# Patient Record
Sex: Female | Born: 1975
Health system: Southern US, Community
[De-identification: ages and names within clinical notes are randomized; demographics above are authoritative.]

---

## 2004-06-29 ENCOUNTER — Inpatient Hospital Stay (HOSPITAL_COMMUNITY): Admission: AD | Admit: 2004-06-29 | Discharge: 2004-06-29 | Payer: Self-pay | Admitting: Obstetrics and Gynecology

## 2004-06-30 ENCOUNTER — Inpatient Hospital Stay (HOSPITAL_COMMUNITY): Admission: AD | Admit: 2004-06-30 | Discharge: 2004-06-30 | Payer: Self-pay | Admitting: Obstetrics & Gynecology

## 2004-07-04 ENCOUNTER — Inpatient Hospital Stay (HOSPITAL_COMMUNITY): Admission: AD | Admit: 2004-07-04 | Discharge: 2004-07-04 | Payer: Self-pay | Admitting: Obstetrics & Gynecology

## 2004-08-14 ENCOUNTER — Inpatient Hospital Stay (HOSPITAL_COMMUNITY): Admission: AD | Admit: 2004-08-14 | Discharge: 2004-08-16 | Payer: Self-pay | Admitting: Obstetrics and Gynecology

## 2004-08-17 ENCOUNTER — Inpatient Hospital Stay (HOSPITAL_COMMUNITY): Admission: AD | Admit: 2004-08-17 | Discharge: 2004-08-17 | Payer: Self-pay | Admitting: Obstetrics and Gynecology

## 2004-08-18 ENCOUNTER — Encounter: Admission: RE | Admit: 2004-08-18 | Discharge: 2004-09-17 | Payer: Self-pay | Admitting: Obstetrics and Gynecology

## 2004-08-18 ENCOUNTER — Inpatient Hospital Stay (HOSPITAL_COMMUNITY): Admission: AD | Admit: 2004-08-18 | Discharge: 2004-08-18 | Payer: Self-pay | Admitting: Obstetrics and Gynecology

## 2006-04-11 ENCOUNTER — Inpatient Hospital Stay (HOSPITAL_COMMUNITY): Admission: AD | Admit: 2006-04-11 | Discharge: 2006-04-13 | Payer: Self-pay | Admitting: Obstetrics and Gynecology

## 2011-10-10 ENCOUNTER — Emergency Department (HOSPITAL_COMMUNITY): Payer: BC Managed Care – PPO

## 2011-10-10 ENCOUNTER — Emergency Department (HOSPITAL_COMMUNITY)
Admission: EM | Admit: 2011-10-10 | Discharge: 2011-10-10 | Disposition: A | Discharge status: Home / Self Care | Payer: BC Managed Care – PPO | Attending: Emergency Medicine | Admitting: Emergency Medicine

## 2011-10-10 DIAGNOSIS — R209 Unspecified disturbances of skin sensation: Secondary | ICD-10-CM | POA: Insufficient documentation

## 2011-10-10 DIAGNOSIS — R200 Anesthesia of skin: Secondary | ICD-10-CM

## 2011-10-10 DIAGNOSIS — R110 Nausea: Secondary | ICD-10-CM | POA: Insufficient documentation

## 2011-10-10 DIAGNOSIS — Q279 Congenital malformation of peripheral vascular system, unspecified: Secondary | ICD-10-CM

## 2011-10-10 LAB — POCT I-STAT, CHEM 8
BUN: 19 mg/dL (ref 6–23)
Calcium, Ion: 1.15 mmol/L (ref 1.12–1.23)
Chloride: 105 mEq/L (ref 96–112)
Creatinine, Ser: 0.7 mg/dL (ref 0.50–1.10)
TCO2: 19 mmol/L (ref 0–100)

## 2011-10-10 LAB — CK TOTAL AND CKMB (NOT AT ARMC)
CK, MB: 1.4 ng/mL (ref 0.3–4.0)
Relative Index: INVALID (ref 0.0–2.5)
Total CK: 58 U/L (ref 7–177)

## 2011-10-10 LAB — URINALYSIS, ROUTINE W REFLEX MICROSCOPIC
Bilirubin Urine: NEGATIVE
Glucose, UA: NEGATIVE mg/dL
Hgb urine dipstick: NEGATIVE
Ketones, ur: NEGATIVE mg/dL
Protein, ur: NEGATIVE mg/dL
pH: 6.5 (ref 5.0–8.0)

## 2011-10-10 LAB — DIFFERENTIAL
Eosinophils Absolute: 0.1 10*3/uL (ref 0.0–0.7)
Eosinophils Relative: 1 % (ref 0–5)
Lymphs Abs: 3.1 10*3/uL (ref 0.7–4.0)
Monocytes Absolute: 0.6 10*3/uL (ref 0.1–1.0)

## 2011-10-10 LAB — RAPID URINE DRUG SCREEN, HOSP PERFORMED
Amphetamines: NOT DETECTED
Benzodiazepines: NOT DETECTED
Cocaine: NOT DETECTED
Opiates: NOT DETECTED

## 2011-10-10 LAB — COMPREHENSIVE METABOLIC PANEL
ALT: 16 U/L (ref 0–35)
Calcium: 9.6 mg/dL (ref 8.4–10.5)
Creatinine, Ser: 0.68 mg/dL (ref 0.50–1.10)
GFR calc Af Amer: >90 mL/min (ref >90)
GFR calc non Af Amer: >90 mL/min (ref >90)
Glucose, Bld: 104 mg/dL — ABNORMAL HIGH (ref 70–99)
Sodium: 136 mEq/L (ref 135–145)
Total Protein: 7.4 g/dL (ref 6.0–8.3)

## 2011-10-10 LAB — CBC
HCT: 42.9 % (ref 36.0–46.0)
MCH: 32.5 pg (ref 26.0–34.0)
MCV: 94.1 fL (ref 78.0–100.0)
Platelets: 271 10*3/uL (ref 150–400)
RBC: 4.56 MIL/uL (ref 3.87–5.11)
RDW: 12.7 % (ref 11.5–15.5)

## 2011-10-10 LAB — PROTIME-INR: Prothrombin Time: 12.9 seconds (ref 11.6–15.2)

## 2011-10-10 LAB — POCT PREGNANCY, URINE: Preg Test, Ur: NEGATIVE

## 2011-10-10 LAB — TROPONIN I: Troponin I: <0.3 ng/mL (ref <0.30)

## 2011-10-10 LAB — URINE MICROSCOPIC-ADD ON

## 2011-10-10 MED ORDER — GADOBENATE DIMEGLUMINE 529 MG/ML IV SOLN
12.0000 mL | Freq: Once | INTRAVENOUS | Status: AC | PRN
  Administered 2011-10-10: 12 mL via INTRAVENOUS

## 2011-10-10 NOTE — ED Notes (Addendum)
Pt in c/o right sided facial numbness x1 hour, states all day she has been feeling nauseous and states she just hasn't felt right, after dinner states she developed severe heart burn, states she has not had that since she was pregnant. Mild right facial droop noted, face symmetrical in movement, no other neuro deficits noted, no weakness to arms or legs. Pt tearful in triage.

## 2011-10-10 NOTE — ED Notes (Signed)
Code Stroke activated per EDP Hunt

## 2011-10-10 NOTE — ED Provider Notes (Signed)
BP 144/99  Pulse 91  Temp 98.5 F (36.9 C) (Oral)  Resp 20  SpO2 97%  LMP 09/09/2011   F/U MRI without CVA or other lesion. 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 Do not suspect as cause of sx. No headache. Will give her f/u with neuro and neurosurgery. No EMC precluding discharge at this time. Given Precautions for return. PMD f/u.   Forbes Cellar, MD 10/10/11 1301

## 2011-10-10 NOTE — ED Notes (Signed)
Patient transported to CT 

## 2011-10-10 NOTE — ED Provider Notes (Signed)
History     CSN: [id number]  Arrival date & time 10/10/11  0202   First MD Initiated Contact with Patient 10/10/11 [phone]      Chief Complaint  Patient presents with  . Facial Numbness   . Nausea    (Consider location/radiation/quality/duration/timing/severity/associated sxs/prior treatment) HPI Patient is a 36 year old female who presents after having acute onset of right lower sided facial numbness at 1 AM. Patient noticed this when she got up to go to the restroom. Patient felt that perhaps the right side of her face looks swollen but denied any weakness. She does not have an obvious facial droop. She's had no associated chest pain or shortness of breath. Patient denies any dysarthria or extremity symptoms. Patient has had recent viral symptoms with some nausea, vomiting, and diarrhea which she thought was something that she caught from her child who had similar symptoms. Patient denies any headache with this. Patient is hypertensive to 190 0 120s on presentation. She has no history of hypertension. She reports that the highest her blood pressures at her primary care physician's office was 130 to 140 systolic. Patient has no family history of early stroke. Patient does not have any other significant findings. No past medical history on file.  No past surgical history on file.  No family history on file.  History  Substance Use Topics  . Smoking status: Not on file  . Smokeless tobacco: Not on file  . Alcohol Use: Not on file    OB History    No data available      Review of Systems  Constitutional: Negative.   HENT: Negative.   Eyes: Negative.   Respiratory: Negative.   Cardiovascular: Negative.   Gastrointestinal: Negative.   Genitourinary: Negative.   Musculoskeletal: Negative.   Skin: Negative.   Neurological: Positive for numbness.  Hematological: Negative.   Psychiatric/Behavioral: Negative.   All other systems reviewed and are negative.    Allergies    Codeine  Home Medications   Current Outpatient Rx  Name Route Sig Dispense Refill  . BISMUTH SUBSALICYLATE 262 MG PO CHEW Oral Chew 524 mg by mouth as needed. Upset stomach.    . CETIRIZINE HCL 10 MG PO TABS Oral Take 10 mg by mouth daily.    Marland Kitchen FLUTICASONE PROPIONATE 50 MCG/ACT NA SUSP Nasal Place 2 sprays into the nose daily.    Marland Kitchen LO/OVRAL PO Oral Take 1 tablet by mouth daily.      BP 143/87  Pulse 82  Temp 98.1 F (36.7 C)  Resp 16  SpO2 100%  LMP 09/09/2011  Physical Exam  Nursing note and vitals reviewed. GEN: Well-developed, well-nourished female in no distress HEENT: Atraumatic, normocephalic. Oropharynx clear without erythema. TMs clear bilaterally. EYES: PERRLA BL, no scleral icterus. NECK: Trachea midline, no meningismus CV: regular rate and rhythm. No murmurs, rubs, or gallops PULM: No respiratory distress.  No crackles, wheezes, or rales. GI: soft, non-tender. No guarding, rebound, or tenderness. + bowel sounds  GU: deferred Neuro: Alert and oriented x3. Patient with right sided lower facial numbness without facial droop. Patient has full movement of the eyebrows bilaterally. Eyes appear symmetric. Patient has no dysarthria.  Patient has no other abnormalities of strength or sensation. NIH stroke scale is at worst a 2 with 1 point for partial facial numbness and one point for mild facial asymmetry  MSK: Patient moves all 4 extremities symmetrically, no deformity, edema, or injury noted Skin: No rashes petechiae, purpura, or jaundice Psych: no abnormality  of mood   ED Course  Procedures (including critical care time)   Indication: stroke-like symptoms Please note this EKG was reviewed extemporaneously by myself.   Date: 10/10/2011  Rate: 85  Rhythm: normal sinus rhythm  QRS Axis: normal  Intervals: normal  ST/T Wave abnormalities: normal  Conduction Disutrbances: none  Narrative Interpretation: unremarkable         Labs Reviewed  COMPREHENSIVE  METABOLIC PANEL - Abnormal; Notable for the following:    Glucose, Bld 104 (*)     All other components within normal limits  URINALYSIS, ROUTINE W REFLEX MICROSCOPIC - Abnormal; Notable for the following:    Leukocytes, UA SMALL (*)     All other components within normal limits  POCT I-STAT, CHEM 8 - Abnormal; Notable for the following:    Glucose, Bld 101 (*)     Hemoglobin 15.6 (*)     All other components within normal limits  URINE MICROSCOPIC-ADD ON - Abnormal; Notable for the following:    Squamous Epithelial / LPF FEW (*)     All other components within normal limits  PROTIME-INR  APTT  CBC  DIFFERENTIAL  URINE RAPID DRUG SCREEN (HOSP PERFORMED)  POCT PREGNANCY, URINE  GLUCOSE, CAPILLARY  CK TOTAL AND CKMB  TROPONIN I   Ct Head Wo Contrast  10/10/2011  *RADIOLOGY REPORT*  Clinical Data: Right-sided facial numbness and droop.  CT HEAD WITHOUT CONTRAST  Technique:  Contiguous axial images were obtained from the base of the skull through the vertex without contrast.  Comparison: None.  Findings: There is no evidence of acute infarction, mass lesion, or intra- or extra-axial hemorrhage on CT.  The posterior fossa, including the cerebellum, brainstem and fourth ventricle, is within normal limits.  The third and lateral ventricles, and basal ganglia are unremarkable in appearance.  The cerebral hemispheres are symmetric in appearance, with normal gray- white differentiation.  No mass effect or midline shift is seen.  There is no evidence of fracture; visualized osseous structures are unremarkable in appearance.  The visualized portions of the orbits are within normal limits.  The paranasal sinuses and mastoid air cells are well-aerated.  No significant soft tissue abnormalities are seen.  IMPRESSION: Unremarkable noncontrast CT of the head.  These results were called by telephone on 10/10/2011  at  03:32 a.m. to  Dr. Cyndra Numbers, who verbally acknowledged these results.  Original Report  Authenticated By: Tonia Ghent, M.D.     No diagnosis found.    MDM  Patient was evaluated by myself. Based upon initial presentation with known last time normal as well as very discrete neurologic symptoms in the setting of hypertension I did discuss the patient with neurology. They did not feel that the patient warranted a code stroke and I agree with her assessment. This is based on the fact the patient had such a mild deficit that she would not be a TPA candidate. Workup for possible neurologic process continued. Patient did not have presentation that was obviously consistent with a Bell's palsy. She did not have complete involvement of the right side of the face and also had no facial weakness with this. Patient had improvement in her symptoms throughout time in the emergency department. Her blood pressure improved spontaneously as well. Patient has MRI that has been ordered at this time. I discussed the patient with neurology again who agreed with plan for her to followup outpatient with neurology if MRI returns normal. Should there be any other findings will contact neurology  again for further management.        Cyndra Numbers, MD 10/10/11 [phone]

## 2011-11-26 ENCOUNTER — Ambulatory Visit: Payer: BC Managed Care – PPO | Admitting: Internal Medicine

## 2012-12-22 ENCOUNTER — Ambulatory Visit: Payer: BC Managed Care – PPO | Admitting: Sports Medicine

## 2013-09-05 IMAGING — CT CT HEAD W/O CM
2 series · 15 of 30 positions shown, 19 images · non-contrast
Comparison: None.

CLINICAL DATA: Right-sided facial numbness and droop.

CT HEAD WITHOUT CONTRAST
TECHNIQUE: Contiguous axial images were obtained from the base of
the skull through the vertex without contrast.

[Series 2: head w/o · axial · non-contrast · 0.43mm/px · z∈[+1605,+1735]mm · 13 of 32 slices shown, 17 images]
[im 3/32  brain]
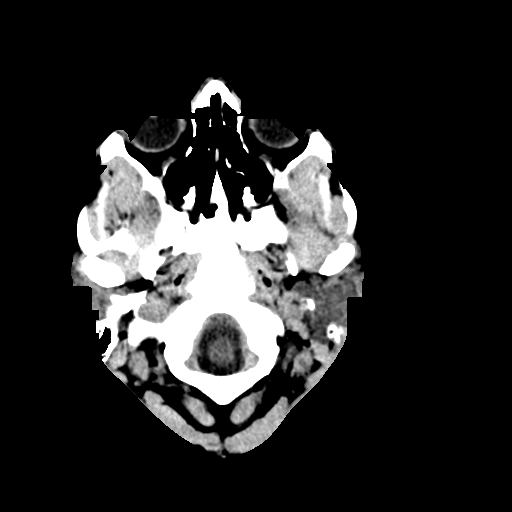
[im 3/32  bone]
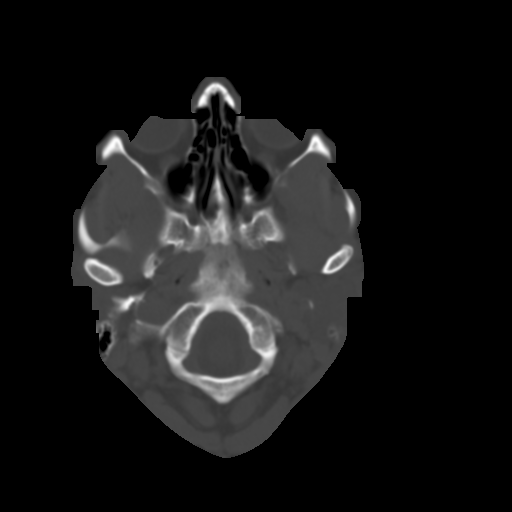
[im 5/32  brain]
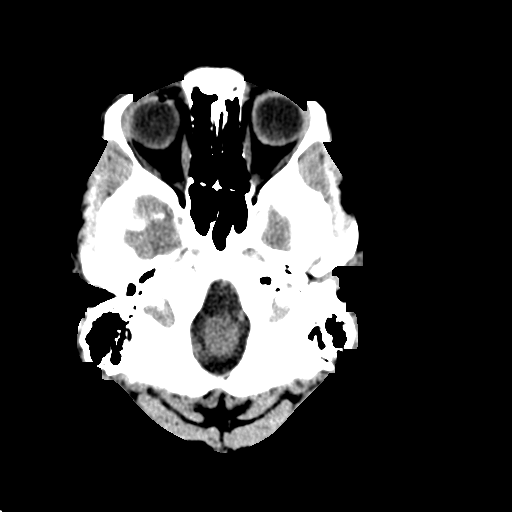
[im 7/32  brain]
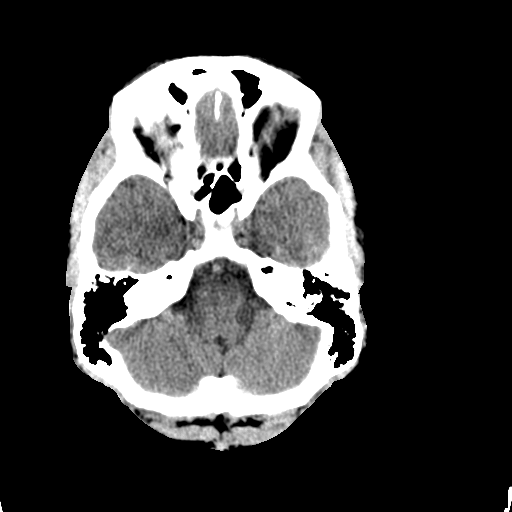
[im 9/32  brain]
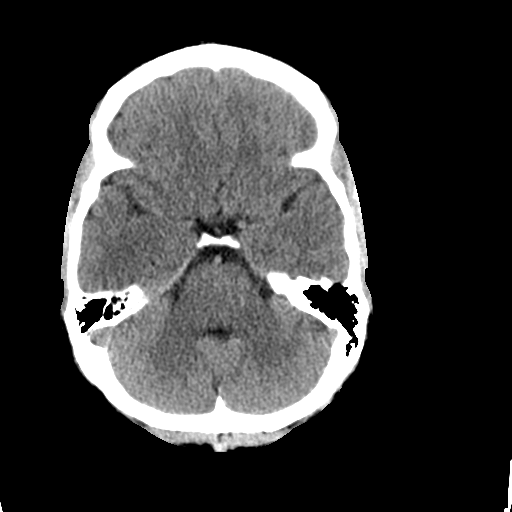
[im 12/32  brain]
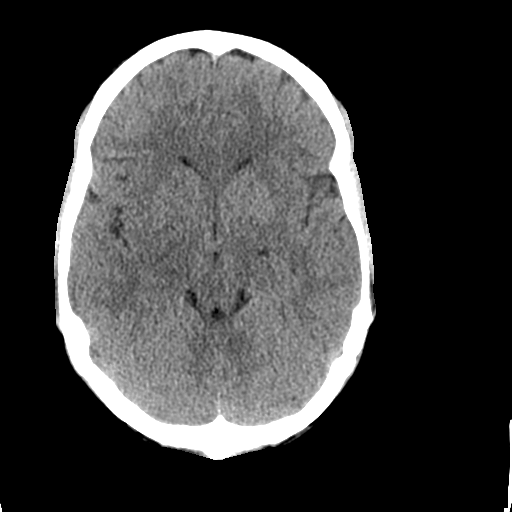
[im 12/32  bone]
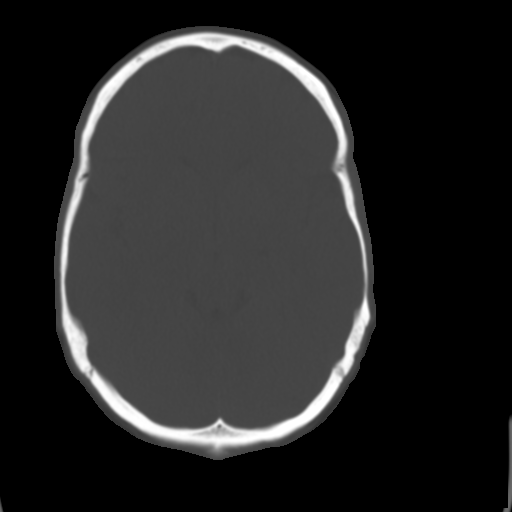
[im 14/32  brain]
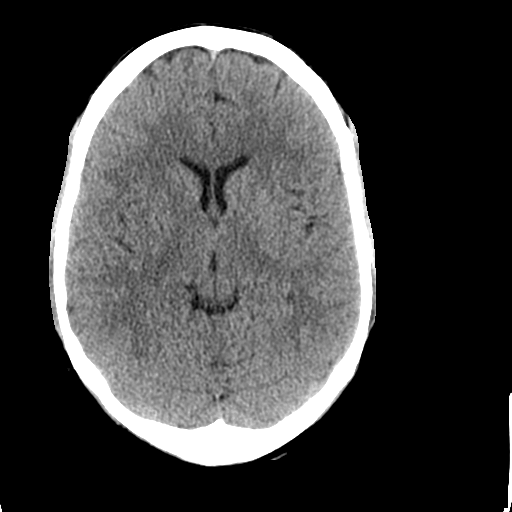
[im 16/32  brain]
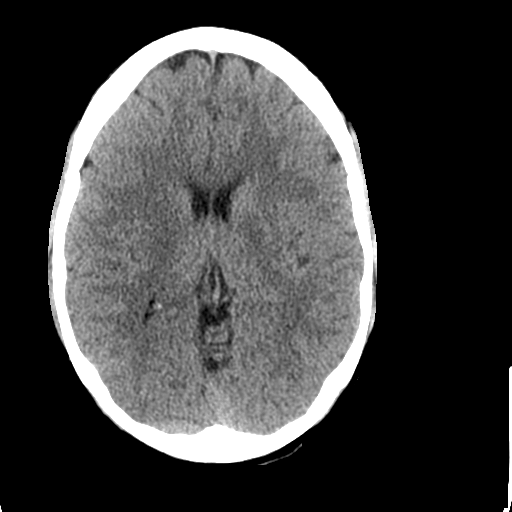
[im 18/32  brain]
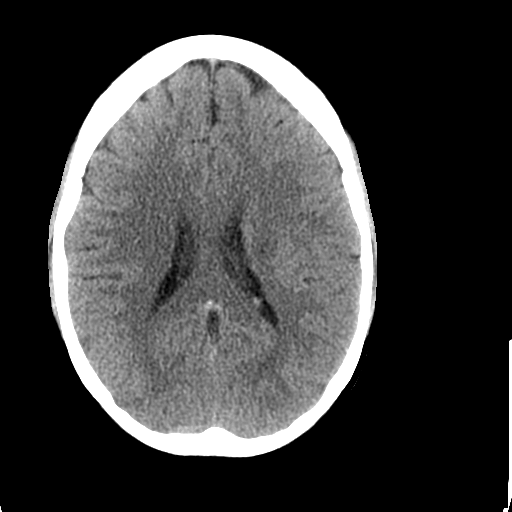
[im 20/32  brain]
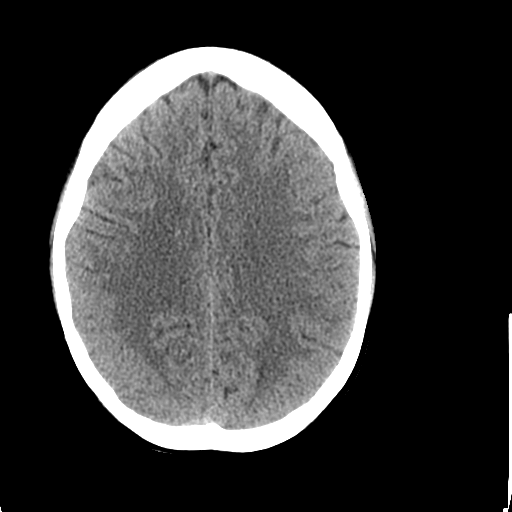
[im 20/32  bone]
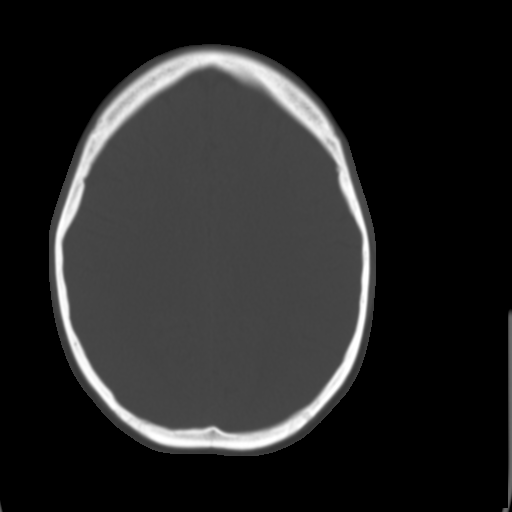
[im 23/32  brain]
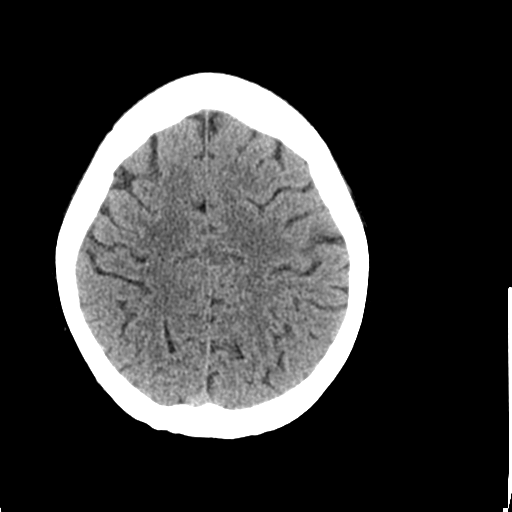
[im 25/32  brain]
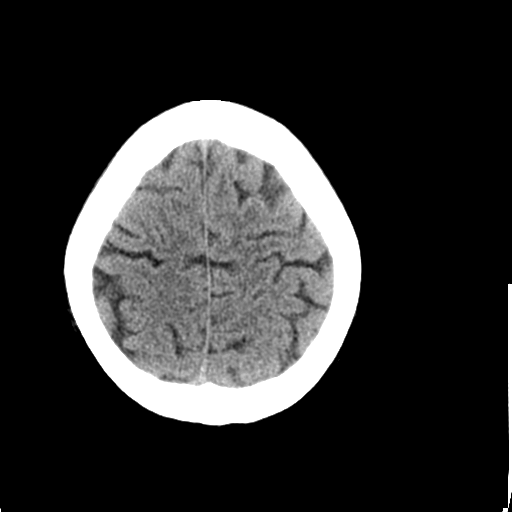
[im 27/32  brain]
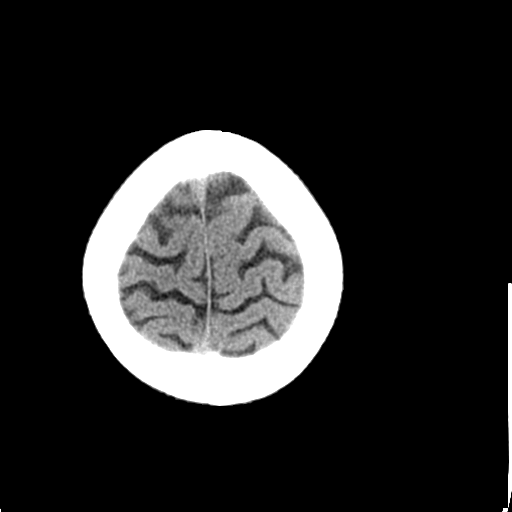
[im 29/32  brain]
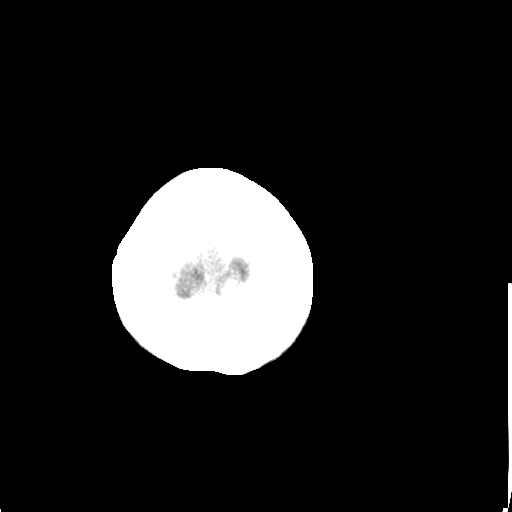
[im 29/32  bone]
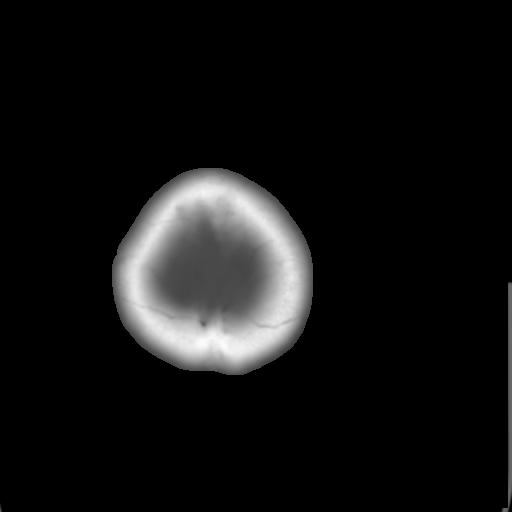

[Series 3: bone windows · axial · 0.43mm/px · z∈[+1605,+1625]mm · 2 of 32 slices shown]
[im 3/32  bone]
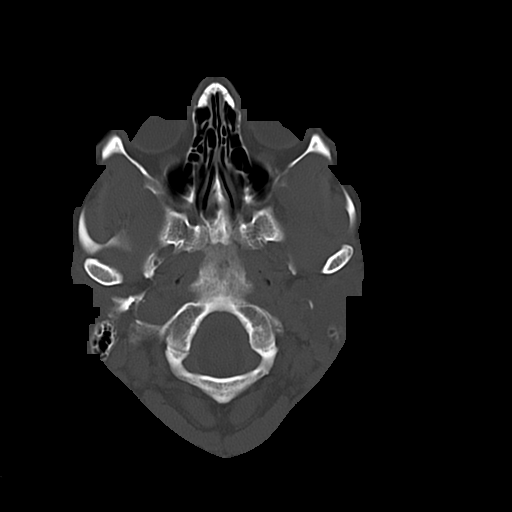
[im 7/32  bone]
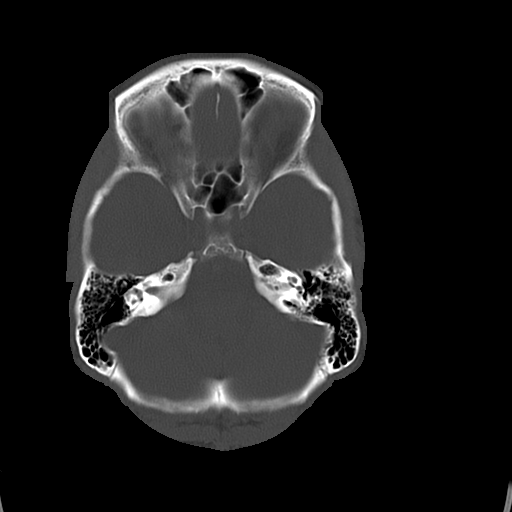

[15 of 30 positions shown; findings below may reference images not displayed]

FINDINGS: There is no evidence of acute infarction, mass lesion, or
intra- or extra-axial hemorrhage on CT.

The posterior fossa, including the cerebellum, brainstem and fourth
ventricle, is within normal limits.  The third and lateral
ventricles, and basal ganglia are unremarkable in appearance.  The
cerebral hemispheres are symmetric in appearance, with normal gray-
white differentiation.  No mass effect or midline shift is seen.

There is no evidence of fracture; visualized osseous structures are
unremarkable in appearance.  The visualized portions of the orbits
are within normal limits.  The paranasal sinuses and mastoid air
cells are well-aerated.  No significant soft tissue abnormalities
are seen.
IMPRESSION: Unremarkable noncontrast CT of the head.

These results were called by telephone on 10/10/2011  at  [DATE]
a.m. to  Dr. Lesliana Petim, who verbally acknowledged these results.

## 2015-10-26 ENCOUNTER — Ambulatory Visit: Payer: BC Managed Care – PPO | Admitting: Sports Medicine

## 2015-10-30 ENCOUNTER — Encounter: Payer: Self-pay | Admitting: Sports Medicine

## 2015-10-30 ENCOUNTER — Ambulatory Visit
Admission: RE | Admit: 2015-10-30 | Discharge: 2015-10-30 | Disposition: A | Discharge status: Home / Self Care | Payer: BC Managed Care – PPO | Source: Ambulatory Visit | Attending: Sports Medicine | Admitting: Sports Medicine

## 2015-10-30 ENCOUNTER — Ambulatory Visit (INDEPENDENT_AMBULATORY_CARE_PROVIDER_SITE_OTHER): Payer: BC Managed Care – PPO | Admitting: Sports Medicine

## 2015-10-30 VITALS — BP 129/92 | Ht 63.0 in | Wt 135.0 lb

## 2015-10-30 DIAGNOSIS — M25522 Pain in left elbow: Secondary | ICD-10-CM

## 2015-10-30 NOTE — Progress Notes (Signed)
   Subjective:    Patient ID: Melanie Hebert, female    DOB: [birth year], 40 y.o.   MRN: [id number]  HPI  Chief complaint: Left elbow pain  Very pleasant left-hand-dominant female comes in at the request of Dr Shelia Media for evaluation of left elbow pain. She denies any trauma or specific inciting event. Instead, she describes a gradual onset of pain that she localizes to the posterior aspect of the left elbow. Her pain is most noticeable with the elbow in extension while lifting up heavy objects. She does get radiating pain down her forearm into her hand. She denies any numbness or tingling. She has not noticed any swelling. She does have a history of tendinitis in this elbow in the past but this pain is different than what she experienced with that. She has tried a counterforce brace (that has worked in the past for her tendinitis) but it has been ineffective. She denies any neck pain. She has been using intermittent doses of over-the-counter naproxen sodium and this has been helpful. She is unable to take ibuprofen or acetaminophen as she has developed allergies to these medications over the years. She denies any pain at rest. She does feel like the elbow will get "stuck " at times.   Past medical history reviewed  Medications reviewed  Allergies reviewed    Review of Systems  as above    Objective:   Physical Exam   well-developed, well-nourished. No acute distress. Awake alert and oriented 3. Vital signs reviewed   left elbow:  Patient lacks about 5 of extension when compared to the uninvolved right elbow. Full flexion. Good pronation and extension. No tenderness to palpation. No soft tissue swelling. No other olecranon bursal swelling. No joint effusion. She has reproducible pain with full extension of the elbow which she localizes to the posterior elbow near the insertion of the triceps tendon. She has excellent strength including with resisted triceps extension. No tenderness over the  medial or lateral epicondyles. Negative Tinel's at the cubital tunnel. Good radial and ulnar pulses. Good grip strength.   MSK ultrasound of the left elbow was performed. Imaging images were obtained and compared to the uninvolved right elbow. Although there is an area of hypoechoic change deep to the triceps tendon on the left, there is also a similar finding in the uninvolved right elbow. This may be incidental or he could represent some mild inflammation at this area.   X-rays of the left elbow are unremarkable. No degenerative changes are seen. No effusion.      Assessment & Plan:    left elbow pain of questionable etiology  I'm going to have the patient take over-the-counter Aleve twice daily for the next 5 days with food. I've also given her a compression sleeve to wear with activity. She is reassured of her x-ray findings and she will follow-up with me in 2-3 weeks. If symptoms are not improving, then I may need to consider merits of further diagnostic imaging.

## 2015-11-20 ENCOUNTER — Ambulatory Visit (INDEPENDENT_AMBULATORY_CARE_PROVIDER_SITE_OTHER): Payer: BC Managed Care – PPO | Admitting: Sports Medicine

## 2015-11-20 VITALS — BP 106/68 | Ht 63.0 in | Wt 135.0 lb

## 2015-11-20 DIAGNOSIS — M25522 Pain in left elbow: Secondary | ICD-10-CM | POA: Diagnosis not present

## 2015-11-20 DIAGNOSIS — S46312D Strain of muscle, fascia and tendon of triceps, left arm, subsequent encounter: Secondary | ICD-10-CM | POA: Diagnosis not present

## 2015-11-20 NOTE — Progress Notes (Signed)
   HPI  Patient presents for follow-up on left elbow pain. Pain has not really changed. She continues to localize it to the posterior aspect of the elbow. Worse with activity such as reaching directly over her head or picking up heavy objects. Previous x-rays were unremarkable. Aleve does seem to help a little bit. She has not noticed much relief with the elbow sleeve. Her symptoms of been present now for about 4 months.  Physical exam: Left elbow: Full range of motion. No effusion. There is tenderness to palpation at the insertion of the triceps tendon onto the olecranon. 4+/5 strength with resisted triceps extension. No tenderness to palpation over the lateral epicondyle and no tenderness to palpation over the medial epicondyle. Decreased grip strength secondary to pain. Good radial and ulnar pulses.  X-rays of the left elbow are as above   Impression/plan:  Persistent left elbow pain-rule out partial triceps tendon tear  MRI of the left elbow specifically to rule out a partial triceps tendon tear. Phone follow-up after those results to delineate further treatment. In the meantime, she may continue with Aleve as needed and will avoid those activities that cause pain.

## 2015-11-25 ENCOUNTER — Other Ambulatory Visit: Payer: BC Managed Care – PPO

## 2017-09-25 IMAGING — CR DG ELBOW 2V*L*
2 series · 2 of 2 positions shown · non-contrast
Comparison: None in PACs

CLINICAL DATA: Three months of elbow pain with no known injury

EXAM:
LEFT ELBOW - 2 VIEW

[x elbow joint ap left]
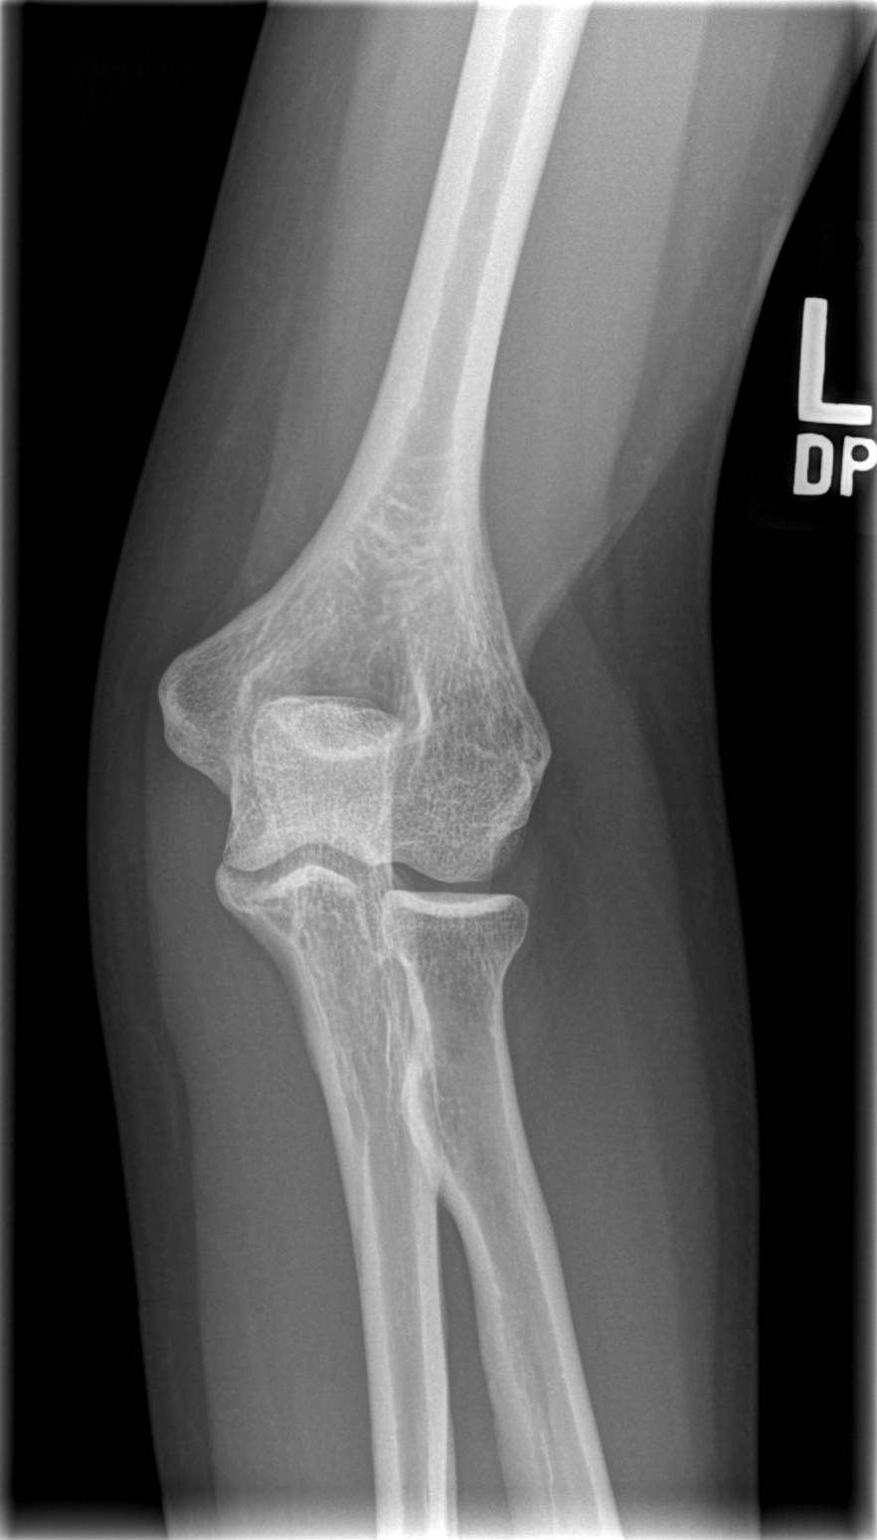

[x elbow joint lat left]
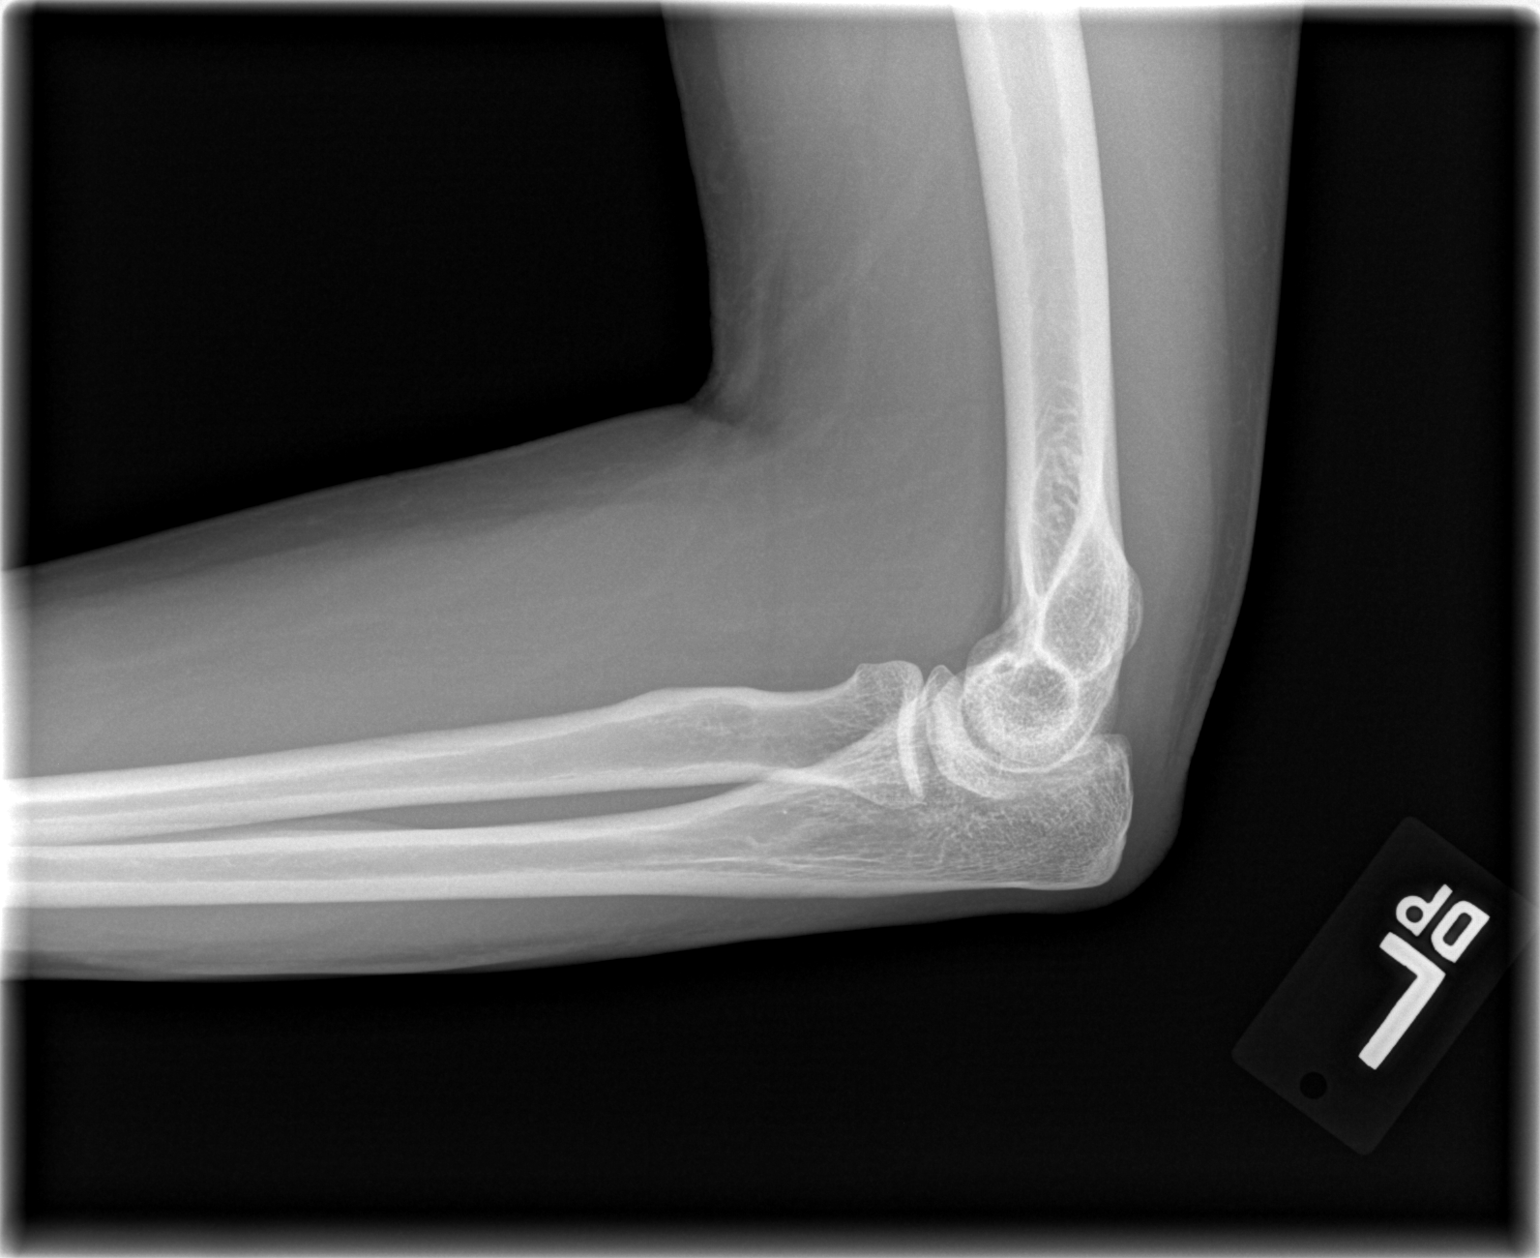

[2 of 2 positions shown; findings below may reference images not displayed]

FINDINGS: The bones are subjectively adequately mineralized. There is no acute
or healing fracture. The radial head is normal in appearance. There
is no olecranon spur. There is no joint effusion.
IMPRESSION: There is no acute or significant chronic bony abnormality of the
left elbow.

## 2019-11-19 ENCOUNTER — Other Ambulatory Visit: Payer: Self-pay

## 2019-11-19 DIAGNOSIS — Z20822 Contact with and (suspected) exposure to covid-19: Secondary | ICD-10-CM

## 2019-11-20 LAB — SARS-COV-2, NAA 2 DAY TAT

## 2019-11-20 LAB — NOVEL CORONAVIRUS, NAA: SARS-CoV-2, NAA: NOT DETECTED
